# Patient Record
Sex: Female | Born: 1990 | Race: Black or African American | Hispanic: No | Marital: Single
Health system: Southern US, Community
[De-identification: ages and names within clinical notes are randomized; demographics above are authoritative.]

## PROBLEM LIST (undated history)

## (undated) DIAGNOSIS — G43909 Migraine, unspecified, not intractable, without status migrainosus: Secondary | ICD-10-CM

---

## 2017-08-11 ENCOUNTER — Emergency Department (HOSPITAL_COMMUNITY): Payer: Managed Care, Other (non HMO)

## 2017-08-11 ENCOUNTER — Encounter (HOSPITAL_COMMUNITY): Payer: Self-pay | Admitting: Emergency Medicine

## 2017-08-11 ENCOUNTER — Emergency Department (HOSPITAL_COMMUNITY)
Admission: EM | Admit: 2017-08-11 | Discharge: 2017-08-11 | Disposition: A | Discharge status: Home / Self Care | Payer: Managed Care, Other (non HMO) | Attending: Emergency Medicine | Admitting: Emergency Medicine

## 2017-08-11 ENCOUNTER — Other Ambulatory Visit: Payer: Self-pay

## 2017-08-11 DIAGNOSIS — G43909 Migraine, unspecified, not intractable, without status migrainosus: Secondary | ICD-10-CM

## 2017-08-11 HISTORY — DX: Migraine, unspecified, not intractable, without status migrainosus: G43.909

## 2017-08-11 MED ORDER — PROMETHAZINE HCL 25 MG/ML IJ SOLN
25.0000 mg | Freq: Once | INTRAMUSCULAR | Status: AC
  Administered 2017-08-11: 25 mg via INTRAVENOUS
  Filled 2017-08-11: qty 1

## 2017-08-11 MED ORDER — ONDANSETRON HCL 4 MG PO TABS: 4.0000 mg | ORAL_TABLET | Freq: Four times a day (QID) | ORAL | 0 refills | Status: AC

## 2017-08-11 MED ORDER — KETOROLAC TROMETHAMINE 30 MG/ML IJ SOLN
30.0000 mg | Freq: Once | INTRAMUSCULAR | Status: AC
Start: 2017-08-11 — End: 2017-08-11
  Administered 2017-08-11: 30 mg via INTRAVENOUS
  Filled 2017-08-11: qty 1

## 2017-08-11 MED ORDER — SODIUM CHLORIDE 0.9 % IV BOLUS
1000.0000 mL | Freq: Once | INTRAVENOUS | Status: AC
  Administered 2017-08-11: 1000 mL via INTRAVENOUS

## 2017-08-11 MED ORDER — METOCLOPRAMIDE HCL 10 MG PO TABS
5.0000 mg | ORAL_TABLET | Freq: Once | ORAL | Status: AC
Start: 2017-08-11 — End: 2017-08-11
  Administered 2017-08-11: 5 mg via ORAL
  Filled 2017-08-11: qty 1

## 2017-08-11 MED ORDER — KETOROLAC TROMETHAMINE 10 MG PO TABS: 10.0000 mg | ORAL_TABLET | Freq: Four times a day (QID) | ORAL | 0 refills | Status: AC | PRN

## 2017-08-11 MED ORDER — DIPHENHYDRAMINE HCL 25 MG PO CAPS
25.0000 mg | ORAL_CAPSULE | Freq: Once | ORAL | Status: AC
  Administered 2017-08-11: 25 mg via ORAL
  Filled 2017-08-11: qty 1

## 2017-08-11 MED ORDER — ONDANSETRON 4 MG PO TBDP
4.0000 mg | ORAL_TABLET | Freq: Once | ORAL | Status: AC
  Administered 2017-08-11: 4 mg via ORAL
  Filled 2017-08-11: qty 1

## 2017-08-11 MED ORDER — ACETAMINOPHEN 325 MG PO TABS
650.0000 mg | ORAL_TABLET | Freq: Once | ORAL | Status: AC
  Administered 2017-08-11: 650 mg via ORAL
  Filled 2017-08-11: qty 2

## 2017-08-11 NOTE — ED Triage Notes (Signed)
Patient complains of migraine that started on Monday, states she went to [facility] ED on 7/29 and was treated for it there. Patient complains of return of pain. Patient alert, oriented, and ambulating independently with steady gait.

## 2017-08-11 NOTE — Discharge Instructions (Addendum)
Please read attached information. If you experience any new or worsening signs or symptoms please return to the emergency room for evaluation. Please follow-up with your primary care provider or specialist as discussed. Please use medication prescribed only as directed and discontinue taking if you have any concerning signs or symptoms.   °

## 2017-08-11 NOTE — ED Provider Notes (Signed)
[facility] Note   CSN: [id number] Arrival date & time: 08/11/17  1422   History   Chief Complaint Chief Complaint  Patient presents with  . Migraine    HPI Cassidy Bailey is a 27 y.o. female.  HPI   27 year old female presents today with complaints of migraine.  Patient notes a history of the same noting she has approximately 15 to 20/month.  Patient notes most recently she had a migraine 10 days ago, she was seen at the [facility] and treated with symptomatic improvement.  She notes the symptoms back shortly after and have persisted since.  She notes this is a pounding pulsing sensation on the left side of her head.  She took over-the-counter medication yesterday without improvement in her symptoms.  Yesterday one episode of vomiting, none today.  She notes this feels similar to previous migraines, no neurological deficits, neck pain stiffness, fever, or any other red flags..    Past Medical History:  Diagnosis Date  . Migraines     There are no active problems to display for this patient.   History reviewed. No pertinent surgical history.   OB History   None      Home Medications    Prior to Admission medications   Medication Sig Start Date End Date Taking? Authorizing Provider  Prenatal Vit-Fe Sulfate-FA (PRENATAL MULTIVIT-IRON PO) Take 2 tablets by mouth daily.   Yes [provider]  ketorolac (TORADOL) 10 MG tablet Take 1 tablet (10 mg total) by mouth every 6 (six) hours as needed. 08/11/17   Joshiah Traynham, Tinnie GensJeffrey, PA-C  ondansetron (ZOFRAN) 4 MG tablet Take 1 tablet (4 mg total) by mouth every 6 (six) hours. 08/11/17   Eyvonne MechanicHedges, Vaness Jelinski, PA-C    Family History No family history on file.  Social History Social History   Tobacco Use  . Smoking status: Not on file  Substance Use Topics  . Alcohol use: Not on file  . Drug use: Not on file     Allergies   Patient has no known allergies.   Review of  Systems Review of Systems  All other systems reviewed and are negative.    Physical Exam Updated Vital Signs BP (!) 120/98 (BP Location: Right Arm)   Pulse 69   Temp 98.7 F (37.1 C) (Oral)   Resp 18   SpO2 100%   Physical Exam  Constitutional: She is oriented to person, place, and time. She appears well-developed and well-nourished.  HENT:  Head: Normocephalic and atraumatic.  Eyes: Pupils are equal, round, and reactive to light. Conjunctivae are normal. Right eye exhibits no discharge. Left eye exhibits no discharge. No scleral icterus.  Neck: Normal range of motion. No JVD present. No tracheal deviation present.  Pulmonary/Chest: Effort normal. No stridor.  Neurological: She is alert and oriented to person, place, and time. She has normal strength. No cranial nerve deficit or sensory deficit. Coordination normal. GCS eye subscore is 4. GCS verbal subscore is 5. GCS motor subscore is 6.  Psychiatric: She has a normal mood and affect. Her behavior is normal. Judgment and thought content normal.  Nursing note and vitals reviewed.    ED Treatments / Results  Labs (all labs ordered are listed, but only abnormal results are displayed) Labs Reviewed - No data to display  EKG None  Radiology Ct Head Wo Contrast  Result Date: 08/11/2017 CLINICAL DATA:  Migraine x3 days EXAM: CT HEAD WITHOUT CONTRAST TECHNIQUE: Contiguous axial images were obtained from  the base of the skull through the vertex without intravenous contrast. COMPARISON:  None. FINDINGS: Brain: No evidence of acute infarction, hemorrhage, hydrocephalus, extra-axial collection or mass lesion/mass effect. Vascular: No hyperdense vessel or unexpected calcification. Skull: Normal. Negative for fracture or focal lesion. Sinuses/Orbits: The visualized paranasal sinuses are essentially clear. The mastoid air cells are unopacified. Other: None. IMPRESSION: Normal head CT. Electronically Signed   By: Charline Bills M.D.   On:  08/11/2017 19:17    Procedures Procedures (including critical care time)  Medications Ordered in ED Medications  sodium chloride 0.9 % bolus 1,000 mL (0 mLs Intravenous Stopped 08/11/17 1945)  metoCLOPramide (REGLAN) tablet 5 mg (5 mg Oral Given 08/11/17 1622)  diphenhydrAMINE (BENADRYL) capsule 25 mg (25 mg Oral Given 08/11/17 1622)  ketorolac (TORADOL) 30 MG/ML injection 30 mg (30 mg Intravenous Given 08/11/17 1621)  ondansetron (ZOFRAN-ODT) disintegrating tablet 4 mg (4 mg Oral Given 08/11/17 1629)  promethazine (PHENERGAN) injection 25 mg (25 mg Intravenous Given 08/11/17 1839)  acetaminophen (TYLENOL) tablet 650 mg (650 mg Oral Given 08/11/17 1839)     Initial Impression / Assessment and Plan / ED Course  I have reviewed the triage vital signs and the nursing notes.  Pertinent labs & imaging results that were available during my care of the patient were reviewed by me and considered in my medical decision making (see chart for details).     Labs:   Imaging: CT head wo  Consults:  Therapeutics: Tylenol, Benadryl, Toradol, Reglan, Zofran, promethazine, normal saline  Discharge Meds:   Assessment/Plan: 27 year old female presents today with migraine.  Patient's CT is reassuring, no neurological deficits.  She did have improvement in her symptoms although not completely.  Patient was given a short prescription of Toradol she has no contraindications.  She will follow-up as an outpatient with neurology return immediately she develops any new or worsening signs or symptoms.  Patient verbalized understanding and agreement to today's plan had no further questions or concerns.   Final Clinical Impressions(s) / ED Diagnoses   Final diagnoses:  Migraine without status migrainosus, not intractable, unspecified migraine type    ED Discharge Orders        Ordered    ketorolac (TORADOL) 10 MG tablet  Every 6 hours PRN     08/11/17 1925    ondansetron (ZOFRAN) 4 MG tablet  Every 6 hours      08/11/17 1925       Rosalio Loud 08/11/17 2126    Bethann Berkshire, MD 08/14/17 [phone]

## 2017-08-24 ENCOUNTER — Encounter: Payer: Self-pay | Admitting: Neurology

## 2017-08-24 ENCOUNTER — Ambulatory Visit: Payer: Managed Care, Other (non HMO) | Admitting: Neurology

## 2017-08-24 ENCOUNTER — Telehealth: Payer: Self-pay | Admitting: *Deleted

## 2017-08-24 DIAGNOSIS — Z0289 Encounter for other administrative examinations: Secondary | ICD-10-CM

## 2017-08-24 NOTE — Telephone Encounter (Signed)
Pt no showed new pt appt on 08/24/2017 @ 09:00.

## 2017-10-19 ENCOUNTER — Telehealth: Payer: Self-pay | Admitting: Neurology

## 2017-10-19 ENCOUNTER — Ambulatory Visit: Payer: Managed Care, Other (non HMO) | Admitting: Neurology

## 2017-10-19 ENCOUNTER — Encounter

## 2017-10-19 NOTE — Telephone Encounter (Signed)
Patient has no-showed 2 recent new appointments please dismiss from practice. Thank you.

## 2017-10-20 ENCOUNTER — Telehealth: Payer: Self-pay | Admitting: Neurology

## 2017-10-20 NOTE — Telephone Encounter (Signed)
FYI- patient has no-showed for 2 new patient appts in 2019.

## 2017-10-21 ENCOUNTER — Encounter: Payer: Self-pay | Admitting: Neurology

## 2019-04-12 IMAGING — CT CT HEAD W/O CM
3 series · 16 of 47 positions shown, 19 images · non-contrast
Comparison: None.

CLINICAL DATA: Migraine x3 days

EXAM:
CT HEAD WITHOUT CONTRAST
TECHNIQUE: Contiguous axial images were obtained from the base of the skull
through the vertex without intravenous contrast.

[Series 3: head 5.0 h30s · axial · 0.41mm/px · z∈[-168,-33]mm · 10 of 33 slices shown, 13 images]
[im 3/33  brain]
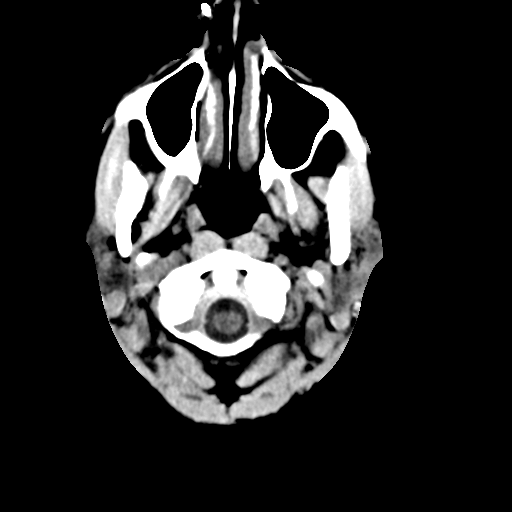
[im 3/33  bone]
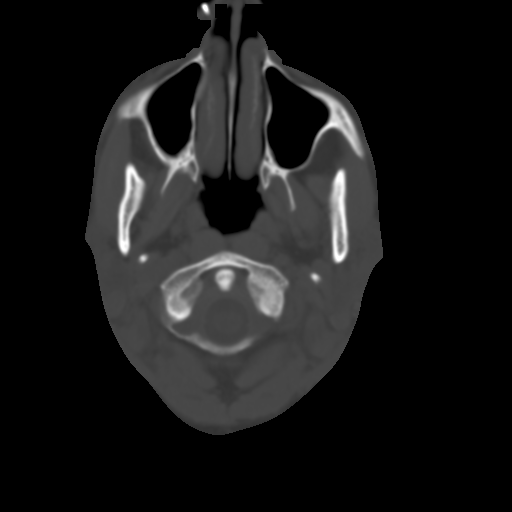
[im 6/33  brain]
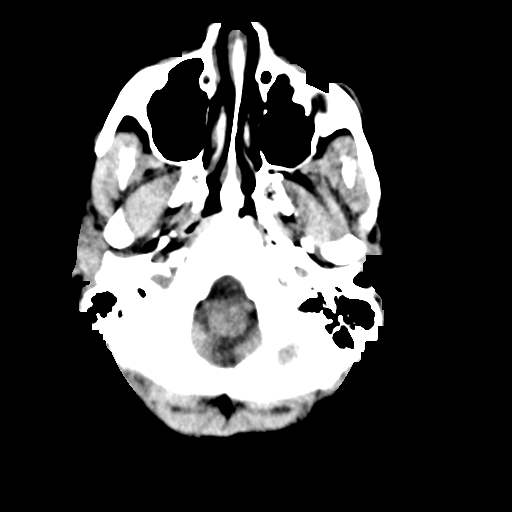
[im 9/33  brain]
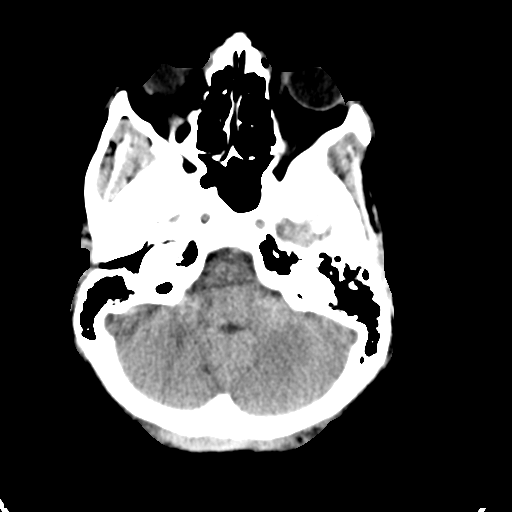
[im 12/33  brain]
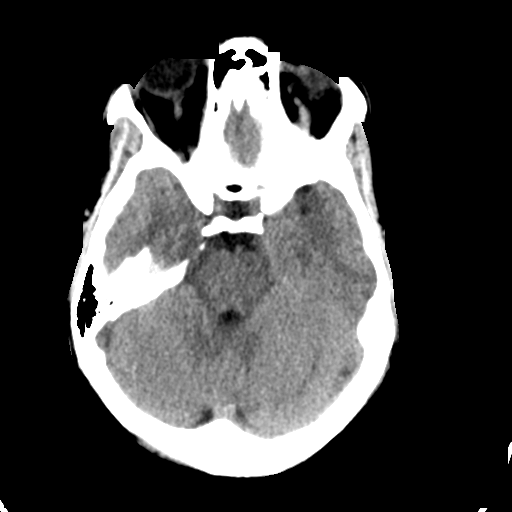
[im 15/33  brain]
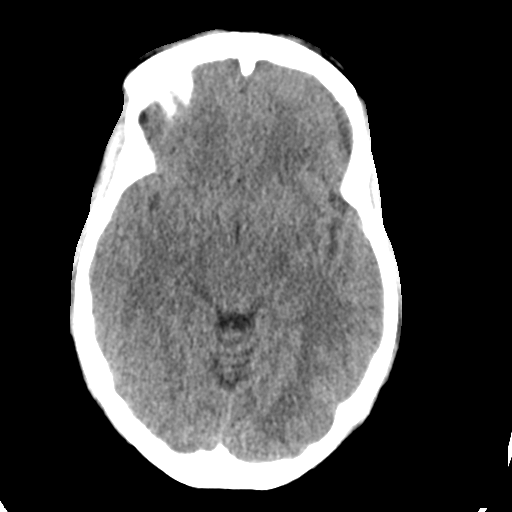
[im 15/33  bone]
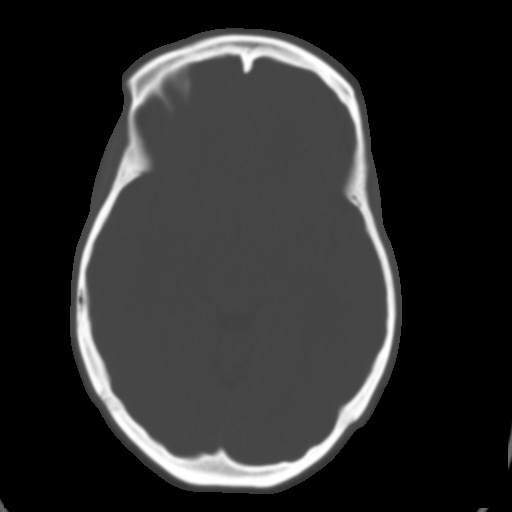
[im 18/33  brain]
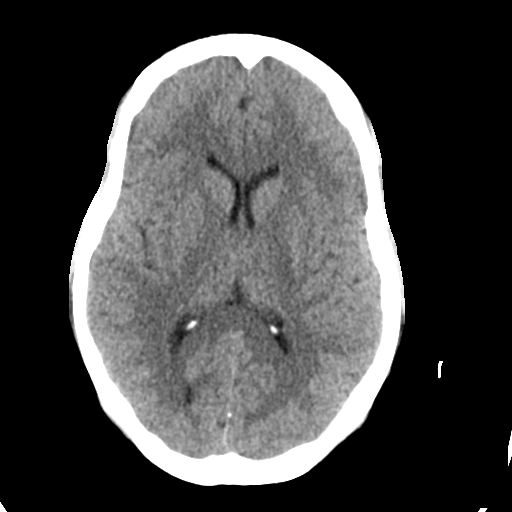
[im 21/33  brain]
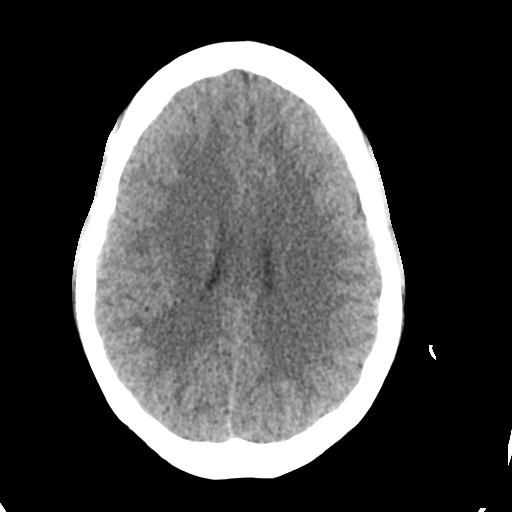
[im 25/33  brain]
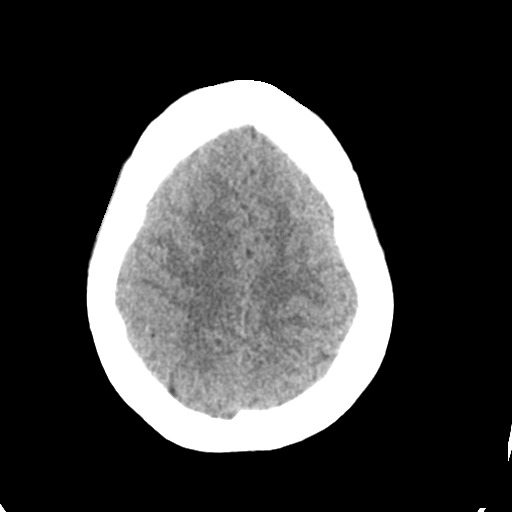
[im 27/33  brain]
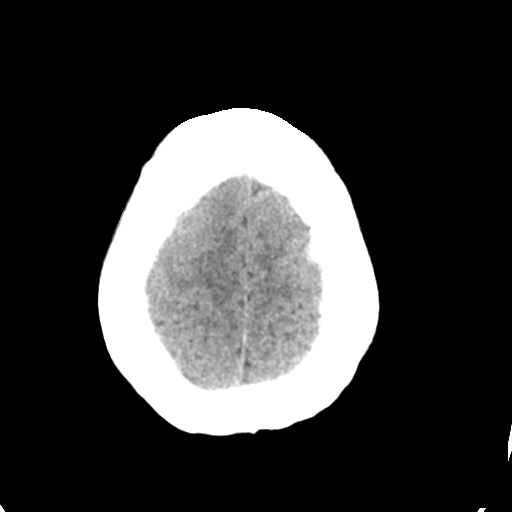
[im 27/33  bone]
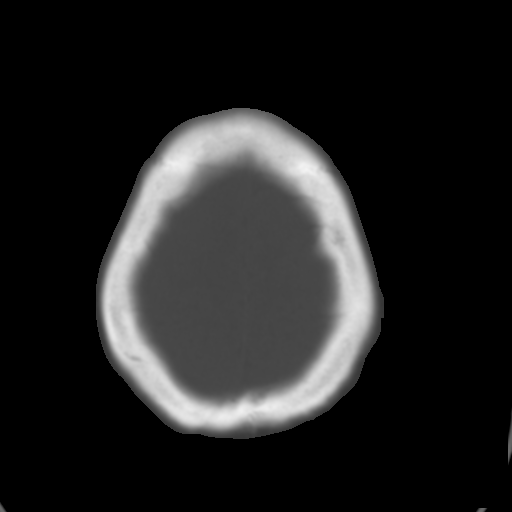
[im 30/33  brain]
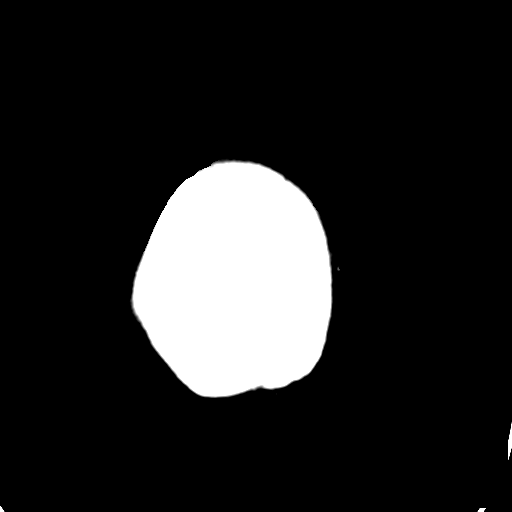

[Series 5: head 3.0 mpr cor · coronal · 0.32mm/px · 3 of 67 slices shown]
[im 23/67  brain]
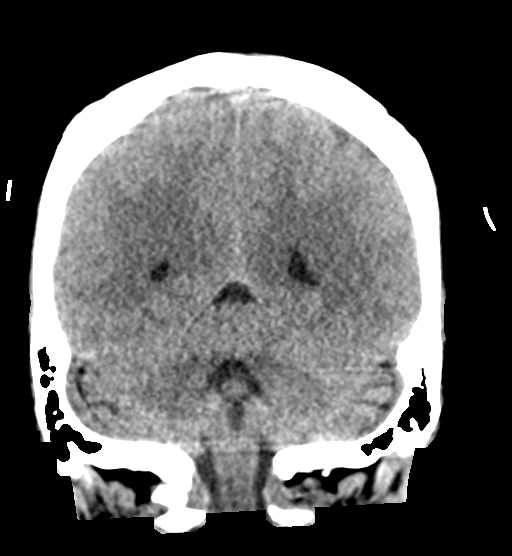
[im 30/67  brain]
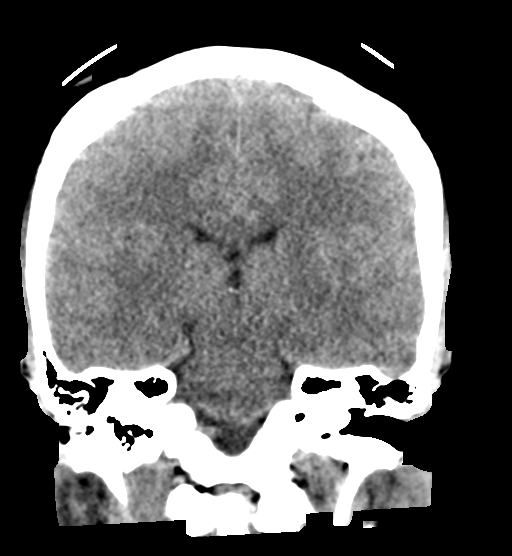
[im 37/67  brain]
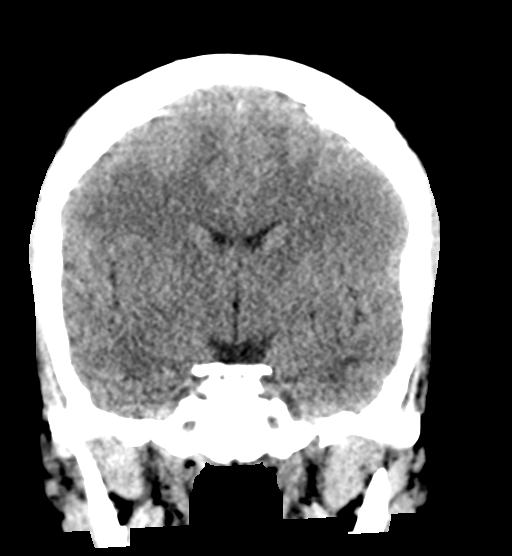

[Series 6: head 3.0 mpr sag · sagittal · 0.33mm/px · 3 of 53 slices shown]
[im 18/53  brain]
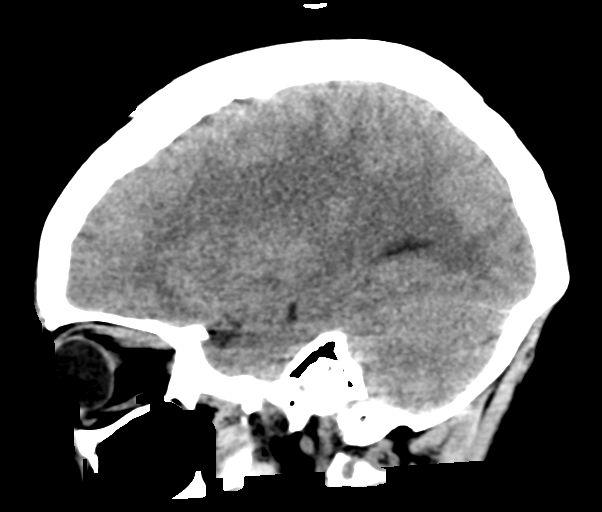
[im 27/53  brain]
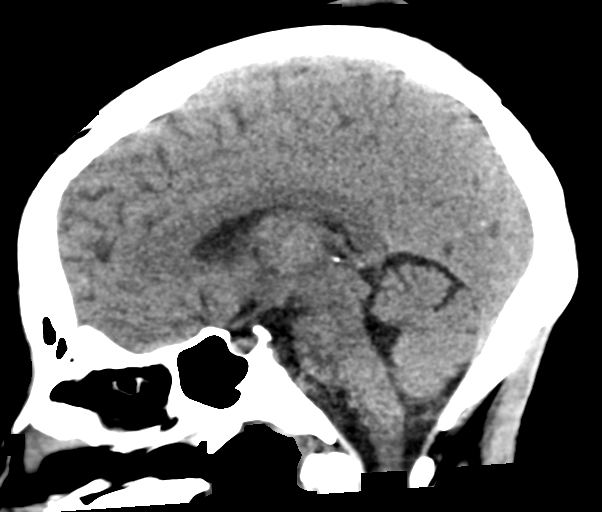
[im 35/53  brain]
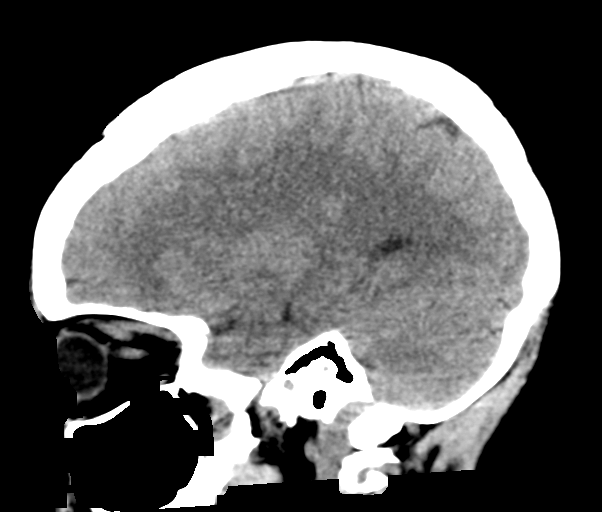

[16 of 47 positions shown; findings below may reference images not displayed]

FINDINGS: Brain: No evidence of acute infarction, hemorrhage, hydrocephalus,
extra-axial collection or mass lesion/mass effect.

Vascular: No hyperdense vessel or unexpected calcification.

Skull: Normal. Negative for fracture or focal lesion.

Sinuses/Orbits: The visualized paranasal sinuses are essentially
clear. The mastoid air cells are unopacified.

Other: None.
IMPRESSION: Normal head CT.
